# Patient Record
Sex: Female | Born: 2000 | Race: White | Hispanic: No | Marital: Single | Smoking: Never smoker
Health system: Southern US, Community
[De-identification: ages and names within clinical notes are randomized; demographics above are authoritative.]

## PROBLEM LIST (undated history)

## (undated) HISTORY — PX: WISDOM TOOTH EXTRACTION: SHX21

---

## 2020-05-19 ENCOUNTER — Other Ambulatory Visit: Payer: Self-pay

## 2020-05-19 DIAGNOSIS — R509 Fever, unspecified: Secondary | ICD-10-CM | POA: Diagnosis not present

## 2020-05-19 DIAGNOSIS — Z20822 Contact with and (suspected) exposure to covid-19: Secondary | ICD-10-CM | POA: Diagnosis not present

## 2020-05-19 DIAGNOSIS — M7918 Myalgia, other site: Secondary | ICD-10-CM | POA: Insufficient documentation

## 2020-05-19 LAB — COMPREHENSIVE METABOLIC PANEL
ALT: 30 U/L (ref 0–44)
AST: 24 U/L (ref 15–41)
Albumin: 4.2 g/dL (ref 3.5–5.0)
Alkaline Phosphatase: 46 U/L (ref 38–126)
Anion gap: 10 (ref 5–15)
BUN: 12 mg/dL (ref 6–20)
CO2: 24 mmol/L (ref 22–32)
Calcium: 9.4 mg/dL (ref 8.9–10.3)
Chloride: 100 mmol/L (ref 98–111)
Creatinine, Ser: 0.89 mg/dL (ref 0.44–1.00)
GFR calc Af Amer: >60 mL/min (ref >60)
GFR calc non Af Amer: >60 mL/min (ref >60)
Glucose, Bld: 110 mg/dL — ABNORMAL HIGH (ref 70–99)
Potassium: 3.6 mmol/L (ref 3.5–5.1)
Sodium: 134 mmol/L — ABNORMAL LOW (ref 135–145)
Total Bilirubin: 0.4 mg/dL (ref 0.3–1.2)
Total Protein: 7.9 g/dL (ref 6.5–8.1)

## 2020-05-19 LAB — CBC WITH DIFFERENTIAL/PLATELET
Abs Immature Granulocytes: 0.02 10*3/uL (ref 0.00–0.07)
Basophils Absolute: 0 10*3/uL (ref 0.0–0.1)
Basophils Relative: 0 %
Eosinophils Absolute: 0 10*3/uL (ref 0.0–0.5)
Eosinophils Relative: 0 %
HCT: 41 % (ref 36.0–46.0)
Hemoglobin: 13.5 g/dL (ref 12.0–15.0)
Immature Granulocytes: 0 %
Lymphocytes Relative: 8 %
Lymphs Abs: 0.6 10*3/uL — ABNORMAL LOW (ref 0.7–4.0)
MCH: 29.3 pg (ref 26.0–34.0)
MCHC: 32.9 g/dL (ref 30.0–36.0)
MCV: 88.9 fL (ref 80.0–100.0)
Monocytes Absolute: 0.4 10*3/uL (ref 0.1–1.0)
Monocytes Relative: 5 %
Neutro Abs: 7.3 10*3/uL (ref 1.7–7.7)
Neutrophils Relative %: 87 %
Platelets: 307 10*3/uL (ref 150–400)
RBC: 4.61 MIL/uL (ref 3.87–5.11)
RDW: 12.7 % (ref 11.5–15.5)
WBC: 8.4 10*3/uL (ref 4.0–10.5)
nRBC: 0 % (ref 0.0–0.2)

## 2020-05-19 LAB — URINALYSIS, COMPLETE (UACMP) WITH MICROSCOPIC
Bilirubin Urine: NEGATIVE
Glucose, UA: NEGATIVE mg/dL
Ketones, ur: NEGATIVE mg/dL
Leukocytes,Ua: NEGATIVE
Nitrite: NEGATIVE
Protein, ur: NEGATIVE mg/dL
Specific Gravity, Urine: 1.008 (ref 1.005–1.030)
pH: 6 (ref 5.0–8.0)

## 2020-05-19 LAB — LACTIC ACID, PLASMA: Lactic Acid, Venous: 1 mmol/L (ref 0.5–1.9)

## 2020-05-19 LAB — POCT PREGNANCY, URINE: Preg Test, Ur: NEGATIVE

## 2020-05-19 MED ORDER — SODIUM CHLORIDE 0.9% FLUSH: 3.0000 mL | Freq: Once | INTRAVENOUS | Status: DC

## 2020-05-19 NOTE — ED Triage Notes (Signed)
PT to ED c/o fever, chills, nausea, back pain and headache. Pt got lost on her way home from work. PT took ibu 200mg  about 1hr ago.

## 2020-05-20 ENCOUNTER — Emergency Department
Admission: EM | Admit: 2020-05-20 | Discharge: 2020-05-20 | Disposition: A | Discharge status: Home / Self Care | Payer: Medicaid Other | Attending: Emergency Medicine | Admitting: Emergency Medicine

## 2020-05-20 ENCOUNTER — Emergency Department: Payer: Medicaid Other

## 2020-05-20 DIAGNOSIS — R509 Fever, unspecified: Secondary | ICD-10-CM

## 2020-05-20 LAB — MONONUCLEOSIS SCREEN: Mono Screen: NEGATIVE

## 2020-05-20 LAB — SARS CORONAVIRUS 2 BY RT PCR (HOSPITAL ORDER, PERFORMED IN ~~LOC~~ HOSPITAL LAB): SARS Coronavirus 2: NEGATIVE

## 2020-05-20 MED ORDER — DOXYCYCLINE HYCLATE 100 MG PO TABS: 100.0000 mg | ORAL_TABLET | Freq: Two times a day (BID) | ORAL | 0 refills | Status: AC

## 2020-05-20 MED ORDER — DOXYCYCLINE HYCLATE 100 MG PO TABS
100.0000 mg | ORAL_TABLET | Freq: Once | ORAL | Status: AC
  Administered 2020-05-20: 100 mg via ORAL
  Filled 2020-05-20: qty 1

## 2020-05-20 MED ORDER — ACETAMINOPHEN 500 MG PO TABS
1000.0000 mg | ORAL_TABLET | Freq: Once | ORAL | Status: AC
  Administered 2020-05-20: 1000 mg via ORAL
  Filled 2020-05-20: qty 2

## 2020-05-20 NOTE — ED Provider Notes (Signed)
[facility] Emergency Department Provider Note  Time seen: 5:10 AM  I have reviewed the triage vital signs and the nursing notes.   HISTORY  Chief Complaint Fever and Back Pain  HPI Anna Crane is a 19 y.o. female with no past medical history who presents to the emergency department with a high fever.  According to the patient today she has been experiencing body aches and fever/chills.  Measured her temperature at home and it was between 103 and 104.  Patient denies most all other symptoms though.  Denies any shortness of breath, abdominal pain, chest pain, vomiting, diarrhea, vaginal discharge or dysuria.  Patient does state last week she had a "sinus infection" in which she had a dry cough, but states the cough has largely resolved.  Patient has not received Covid vaccinations.  Patient did state some mild confusion today which she attributes to the high fever.  History reviewed. No pertinent past medical history.  There are no problems to display for this patient.   Past Surgical History:  Procedure Laterality Date  . WISDOM TOOTH EXTRACTION      Prior to Admission medications   Not on File    No Known Allergies  No family history on file.  Social History Social History   Tobacco Use  . Smoking status: Never Smoker  . Smokeless tobacco: Never Used  Substance Use Topics  . Alcohol use: Yes    Comment: occaisonal   . Drug use: Never    Review of Systems Constitutional: Positive for high fever. ENT: Congestion last week but none currently. Cardiovascular: Negative for chest pain. Respiratory: Negative for shortness of breath.  Cough last week, now resolved. Gastrointestinal: Negative for abdominal pain, vomiting and diarrhea. Genitourinary: Negative for urinary compaints Musculoskeletal: Positive for body aches. Neurological: Mild headache, gone currently All other ROS negative  ____________________________________________   PHYSICAL  EXAM:  VITAL SIGNS: ED Triage Vitals  Enc Vitals Group     BP 05/19/20 2141 130/86     Pulse Rate 05/19/20 2141 (!) 130     Resp 05/19/20 2141 20     Temp 05/19/20 2141 (!) 102.8 F (39.3 C)     Temp Source 05/19/20 2141 Oral     SpO2 05/19/20 2141 99 %     Weight 05/19/20 2146 145 lb (65.8 kg)     Height 05/19/20 2146 5\' 3"  (1.6 m)     Head Circumference --      Peak Flow --      Pain Score 05/19/20 2145 7     Pain Loc --      Pain Edu? --      Excl. in [place]? --    Constitutional: Alert and oriented. Well appearing and in no distress. Eyes: Normal exam ENT      Head: Normocephalic and atraumatic.      Mouth/Throat: Mucous membranes are moist.  Minimal pharyngeal erythema no exudates or hypertrophy. Cardiovascular: Normal rate, regular rhythm around 100 bpm. Respiratory: Normal respiratory effort without tachypnea nor retractions. Breath sounds are clear, without wheeze rales or rhonchi. Gastrointestinal: Soft and nontender. No distention.   Musculoskeletal: Nontender with normal range of motion in all extremities. Neurologic:  Normal speech and language. No gross focal neurologic deficits  Skin:  Skin is warm, dry and intact.  Psychiatric: Mood and affect are normal.  No rash.  ____________________________________________   RADIOLOGY  Chest x-ray is clear  ____________________________________________   INITIAL IMPRESSION / ASSESSMENT AND PLAN /  ED COURSE  Pertinent labs & imaging results that were available during my care of the patient were reviewed by me and considered in my medical decision making (see chart for details).   Patient presents to the emergency department for body aches and high fever.  Patient is febrile to 102.8 in the emergency department.  Took ibuprofen just prior to arrival but is now been here nearly 8 hours.  We will dose Tylenol.  I rechecked the patient temperature in the room during my evaluation which is 99.0.  Pulse rate appeared to have  decreased to around 100 bpm.  Lab work is largely nonrevealing including a normal urinalysis, normal white blood cell count and normal lactate.  We will obtain a Covid swab and chest x-ray as a precaution.  Overall the patient appears well, no distress and nontoxic in appearance.  Chest x-ray is clear.  Covid test negative.  Patient did state a slight headache earlier tonight however on my exam denies headache right now.  No meningismus.  Denies neck pain or back pain.  Patient did state she has had several ticks on her but does not know of any tick bites.  However given the patient's high fever we will cover with doxycycline as a precaution.  Patient will follow up with her doctor.  I discussed supportive care at home.  Patient agreeable to plan of care.  Anna-Marie Coller was evaluated in Emergency Department on 05/20/2020 for the symptoms described in the history of present illness. She was evaluated in the context of the global COVID-19 pandemic, which necessitated consideration that the patient might be at risk for infection with the SARS-CoV-2 virus that causes COVID-19. Institutional protocols and algorithms that pertain to the evaluation of patients at risk for COVID-19 are in a state of rapid change based on information released by regulatory bodies including the CDC and federal and state organizations. These policies and algorithms were followed during the patient's care in the ED.  ____________________________________________   FINAL CLINICAL IMPRESSION(S) / ED DIAGNOSES  Fever   Minna Antis, MD 05/20/20 [phone]

## 2021-07-11 ENCOUNTER — Ambulatory Visit (LOCAL_COMMUNITY_HEALTH_CENTER): Payer: Medicaid Other

## 2021-07-11 ENCOUNTER — Other Ambulatory Visit: Payer: Self-pay

## 2021-07-11 DIAGNOSIS — Z3202 Encounter for pregnancy test, result negative: Secondary | ICD-10-CM

## 2021-07-11 DIAGNOSIS — Z23 Encounter for immunization: Secondary | ICD-10-CM

## 2021-07-11 NOTE — Progress Notes (Signed)
In Nurse Clinic for Rabies Vaccine. Works at [employer]. Reports no periods. On ocp. Admits to missing "couple of pills" but denies missing any pills for past month. Last sex last week. Consult Ralene Bathe, MD who orders preg test and if negative, may proceed with vaccine today. UPT negative today. Rabies vaccine administered and tolerated well. Updated [facility] copy given. Has appt for 07/20/2021 for Rabies vaccine #2. Jerel Shepherd, RN

## 2021-07-12 LAB — PREGNANCY, URINE: Preg Test, Ur: NEGATIVE

## 2021-07-12 NOTE — Progress Notes (Addendum)
Attestation: I agree with the advice given to this patient by our nurse staff.  I have reviewed the RN's note and chart. Documentation reflects my recommendations which were given verbally to the nurse at the point of care.   Dezmond Downie Niles Broxton Broady, MD, MPH, ABFM Medical Director  [facility]  

## 2021-07-18 ENCOUNTER — Ambulatory Visit (LOCAL_COMMUNITY_HEALTH_CENTER): Payer: Self-pay

## 2021-07-18 ENCOUNTER — Other Ambulatory Visit: Payer: Self-pay

## 2021-07-18 DIAGNOSIS — Z23 Encounter for immunization: Secondary | ICD-10-CM

## 2021-07-18 NOTE — Progress Notes (Signed)
Pt to clinic for employer paid Rabavert vaccine. Discussed Td/Tdap booster with pt; pt states she will check about her insurance status and will address it at another visit. Per standing order, only 2 doses needed to complete initial series.

## 2021-12-13 IMAGING — DX DG CHEST 1V PORT
1 series · 1 of 1 positions shown · non-contrast
Comparison: None.

CLINICAL DATA: Fever

EXAM:
PORTABLE CHEST 1 VIEW

[chest ap]
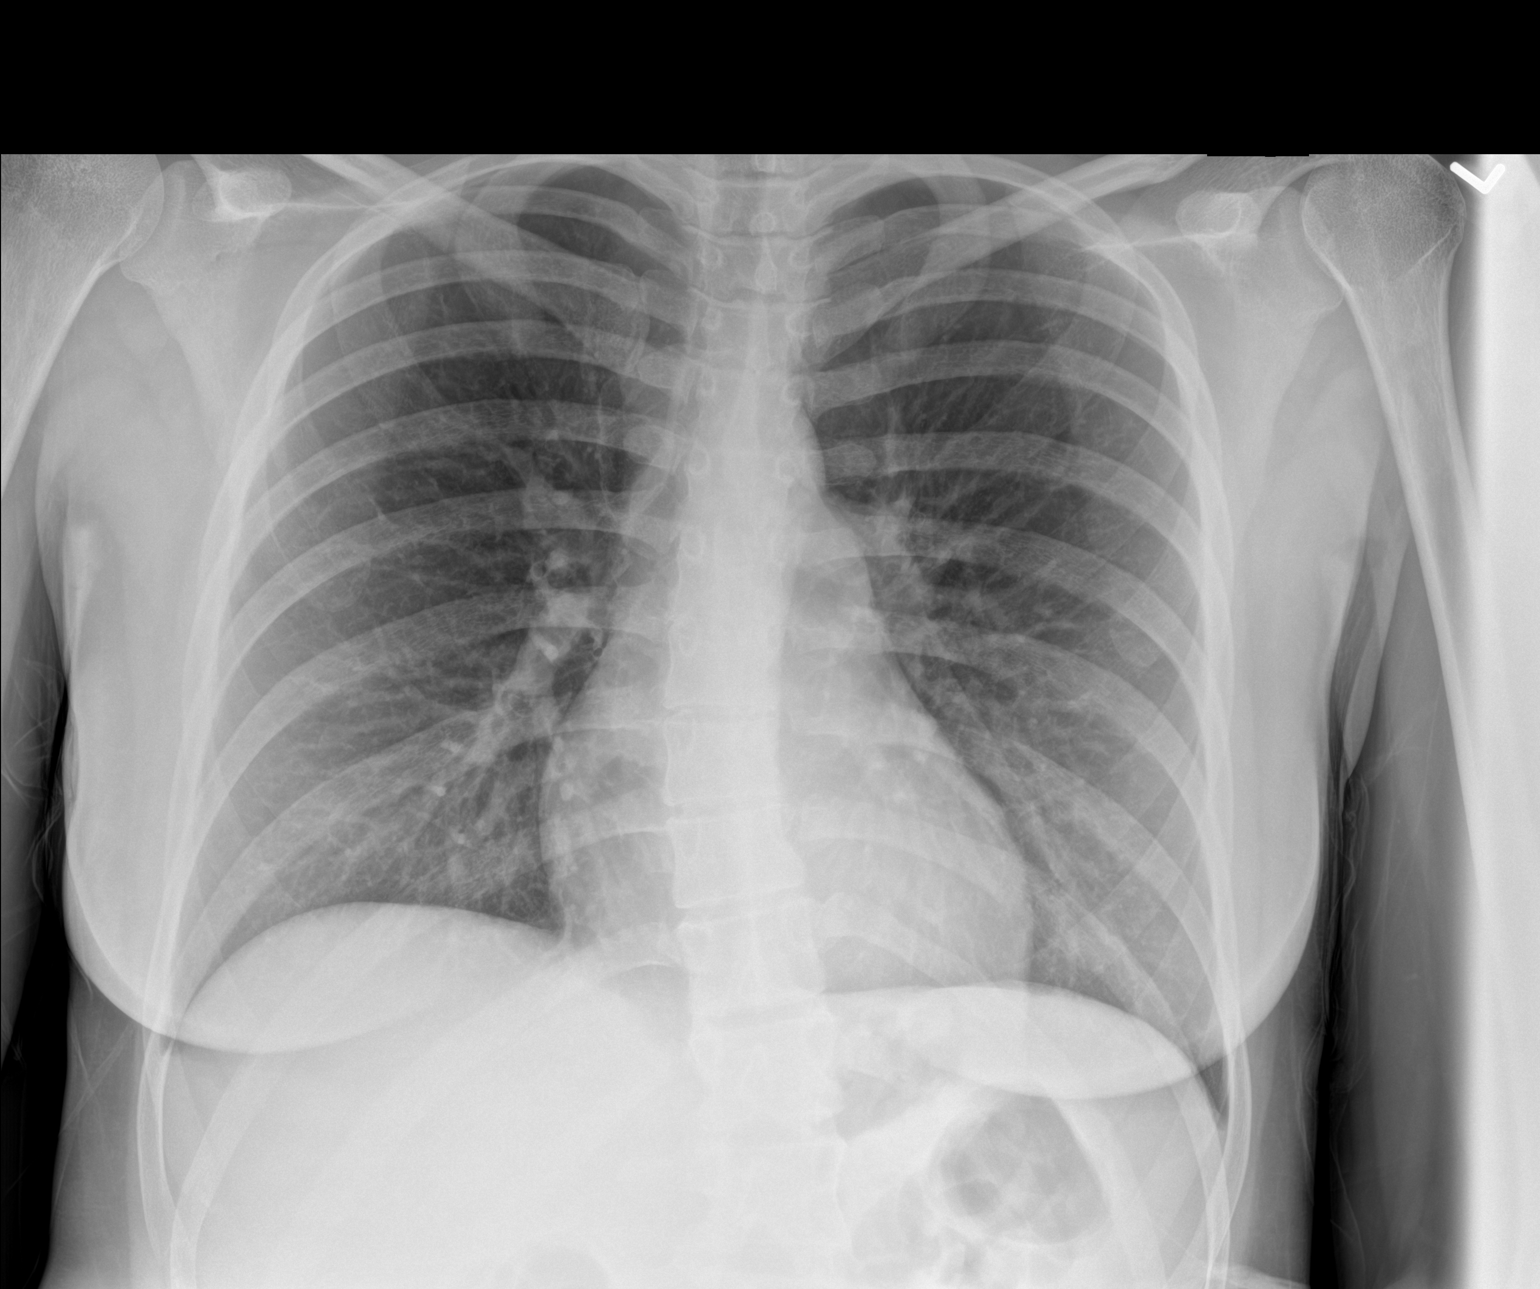

[1 of 1 positions shown; findings below may reference images not displayed]

FINDINGS: The heart size and mediastinal contours are within normal limits.
Both lungs are clear. The visualized skeletal structures are
unremarkable.
IMPRESSION: No active disease.

## 2024-12-30 ENCOUNTER — Encounter: Payer: Self-pay | Admitting: Adult Health

## 2024-12-31 ENCOUNTER — Encounter: Admitting: Adult Health

## 2025-01-06 ENCOUNTER — Encounter: Admitting: Adult Health

## 2025-03-17 ENCOUNTER — Encounter: Admitting: Adult Health
